# Patient Record
Sex: Male | Born: 1985 | Race: Black or African American | Hispanic: No | Marital: Single | Smoking: Current every day smoker
Health system: Southern US, Community
[De-identification: ages and names within clinical notes are randomized; demographics above are authoritative.]

## PROBLEM LIST (undated history)

## (undated) DIAGNOSIS — Z789 Other specified health status: Secondary | ICD-10-CM

## (undated) DIAGNOSIS — Z202 Contact with and (suspected) exposure to infections with a predominantly sexual mode of transmission: Secondary | ICD-10-CM

---
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
## 2010-02-16 ENCOUNTER — Emergency Department (HOSPITAL_COMMUNITY): Admission: EM | Admit: 2010-02-16 | Discharge: 2010-02-16 | Payer: Self-pay | Admitting: Emergency Medicine

## 2010-04-30 ENCOUNTER — Inpatient Hospital Stay (INDEPENDENT_AMBULATORY_CARE_PROVIDER_SITE_OTHER)
Admission: RE | Admit: 2010-04-30 | Discharge: 2010-04-30 | Disposition: A | Discharge status: Home / Self Care | Payer: Self-pay | Source: Ambulatory Visit | Attending: Family Medicine | Admitting: Family Medicine

## 2010-04-30 DIAGNOSIS — R369 Urethral discharge, unspecified: Secondary | ICD-10-CM

## 2010-04-30 DIAGNOSIS — A64 Unspecified sexually transmitted disease: Secondary | ICD-10-CM

## 2010-04-30 LAB — POCT URINALYSIS DIPSTICK
Bilirubin Urine: NEGATIVE
Ketones, ur: NEGATIVE mg/dL
Urobilinogen, UA: 0.2 mg/dL (ref 0.0–1.0)

## 2010-05-01 LAB — GC/CHLAMYDIA PROBE AMP, GENITAL: GC Probe Amp, Genital: POSITIVE — AB
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
## 2012-06-09 ENCOUNTER — Encounter (HOSPITAL_COMMUNITY): Payer: Self-pay | Admitting: *Deleted

## 2012-06-09 ENCOUNTER — Emergency Department (HOSPITAL_COMMUNITY): Payer: No Typology Code available for payment source

## 2012-06-09 ENCOUNTER — Inpatient Hospital Stay (HOSPITAL_COMMUNITY)
Admission: EM | Admit: 2012-06-09 | Discharge: 2012-06-11 | DRG: 087 | Disposition: A | Discharge status: Home / Self Care | Payer: No Typology Code available for payment source | Attending: Emergency Medicine | Admitting: Emergency Medicine

## 2012-06-09 DIAGNOSIS — F1010 Alcohol abuse, uncomplicated: Secondary | ICD-10-CM | POA: Diagnosis present

## 2012-06-09 DIAGNOSIS — S0291XB Unspecified fracture of skull, initial encounter for open fracture: Secondary | ICD-10-CM

## 2012-06-09 DIAGNOSIS — F17200 Nicotine dependence, unspecified, uncomplicated: Secondary | ICD-10-CM | POA: Diagnosis present

## 2012-06-09 DIAGNOSIS — S0280XB Fracture of other specified skull and facial bones, unspecified side, initial encounter for open fracture: Principal | ICD-10-CM | POA: Diagnosis present

## 2012-06-09 DIAGNOSIS — S61409A Unspecified open wound of unspecified hand, initial encounter: Secondary | ICD-10-CM | POA: Diagnosis present

## 2012-06-09 DIAGNOSIS — S0100XA Unspecified open wound of scalp, initial encounter: Secondary | ICD-10-CM | POA: Diagnosis present

## 2012-06-09 DIAGNOSIS — S0291XA Unspecified fracture of skull, initial encounter for closed fracture: Secondary | ICD-10-CM

## 2012-06-09 DIAGNOSIS — S0101XA Laceration without foreign body of scalp, initial encounter: Secondary | ICD-10-CM

## 2012-06-09 DIAGNOSIS — F411 Generalized anxiety disorder: Secondary | ICD-10-CM | POA: Diagnosis present

## 2012-06-09 HISTORY — DX: Other specified health status: Z78.9

## 2012-06-09 LAB — COMPREHENSIVE METABOLIC PANEL
CO2: 24 mEq/L (ref 19–32)
Calcium: 8.9 mg/dL (ref 8.4–10.5)
Creatinine, Ser: 0.76 mg/dL (ref 0.50–1.35)
GFR calc non Af Amer: >90 mL/min (ref >90)
Potassium: 3.2 mEq/L — ABNORMAL LOW (ref 3.5–5.1)
Sodium: 138 mEq/L (ref 135–145)
Total Bilirubin: 0.2 mg/dL — ABNORMAL LOW (ref 0.3–1.2)

## 2012-06-09 LAB — CBC WITH DIFFERENTIAL/PLATELET
Basophils Absolute: 0.1 10*3/uL (ref 0.0–0.1)
Eosinophils Relative: 1 % (ref 0–5)
Hemoglobin: 14.3 g/dL (ref 13.0–17.0)
Lymphocytes Relative: 54 % — ABNORMAL HIGH (ref 12–46)
Lymphs Abs: 3.6 10*3/uL (ref 0.7–4.0)
MCH: 33.1 pg (ref 26.0–34.0)
MCHC: 35.1 g/dL (ref 30.0–36.0)
MCV: 94.2 fL (ref 78.0–100.0)
Monocytes Relative: 8 % (ref 3–12)
Neutro Abs: 2.4 10*3/uL (ref 1.7–7.7)
Platelets: 224 10*3/uL (ref 150–400)
RBC: 4.32 MIL/uL (ref 4.22–5.81)

## 2012-06-09 LAB — POCT I-STAT, CHEM 8
BUN: 7 mg/dL (ref 6–23)
Calcium, Ion: 1.05 mmol/L — ABNORMAL LOW (ref 1.12–1.23)
Glucose, Bld: 140 mg/dL — ABNORMAL HIGH (ref 70–99)
HCT: 45 % (ref 39.0–52.0)

## 2012-06-09 LAB — CG4 I-STAT (LACTIC ACID): Lactic Acid, Venous: 3.28 mmol/L — ABNORMAL HIGH (ref 0.5–2.2)

## 2012-06-09 MED ORDER — POTASSIUM CHLORIDE CRYS ER 20 MEQ PO TBCR
40.0000 meq | EXTENDED_RELEASE_TABLET | Freq: Once | ORAL | Status: AC
  Administered 2012-06-09: 40 meq via ORAL
  Filled 2012-06-09: qty 2

## 2012-06-09 MED ORDER — TETANUS-DIPHTH-ACELL PERTUSSIS 5-2.5-18.5 LF-MCG/0.5 IM SUSP
0.5000 mL | Freq: Once | INTRAMUSCULAR | Status: AC
  Administered 2012-06-09: 0.5 mL via INTRAMUSCULAR
  Filled 2012-06-09: qty 0.5
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
## 2012-06-09 NOTE — ED Notes (Signed)
Vital signs stable. 
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
## 2012-06-09 NOTE — Progress Notes (Signed)
Orthopedic Tech Progress Note Patient Details:  Hunter Hart [birth year] [id number]  Patient ID: Hunter Hart, male   DOB: July 12, 1985, 27 y.o.   MRN: [id number] Made level 1 trauma visit  Nikki Dom 06/09/2012, 10:42 PM
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
## 2012-06-10 ENCOUNTER — Encounter (HOSPITAL_COMMUNITY): Payer: Self-pay | Admitting: *Deleted

## 2012-06-10 ENCOUNTER — Emergency Department (HOSPITAL_COMMUNITY): Payer: No Typology Code available for payment source

## 2012-06-10 DIAGNOSIS — S020XXA Fracture of vault of skull, initial encounter for closed fracture: Secondary | ICD-10-CM

## 2012-06-10 LAB — COMPREHENSIVE METABOLIC PANEL
AST: 39 U/L — ABNORMAL HIGH (ref 0–37)
Albumin: 3.2 g/dL — ABNORMAL LOW (ref 3.5–5.2)
BUN: 5 mg/dL — ABNORMAL LOW (ref 6–23)
Creatinine, Ser: 0.65 mg/dL (ref 0.50–1.35)
Total Protein: 6.8 g/dL (ref 6.0–8.3)

## 2012-06-10 LAB — CBC
HCT: 33.1 % — ABNORMAL LOW (ref 39.0–52.0)
MCHC: 35.6 g/dL (ref 30.0–36.0)
MCV: 91.7 fL (ref 78.0–100.0)
Platelets: 195 10*3/uL (ref 150–400)
RDW: 14.3 % (ref 11.5–15.5)
WBC: 5.9 10*3/uL (ref 4.0–10.5)

## 2012-06-10 LAB — ABO/RH: ABO/RH(D): B POS

## 2012-06-10 LAB — MRSA PCR SCREENING: MRSA by PCR: NEGATIVE

## 2012-06-10 MED ORDER — HYDROMORPHONE HCL PF 1 MG/ML IJ SOLN: 1.0000 mg | INTRAMUSCULAR | Status: DC | PRN

## 2012-06-10 MED ORDER — HYDROMORPHONE HCL PF 1 MG/ML IJ SOLN
1.0000 mg | INTRAMUSCULAR | Status: DC | PRN
  Administered 2012-06-10 (×5): 1 mg via INTRAVENOUS
  Filled 2012-06-10 (×5): qty 1

## 2012-06-10 MED ORDER — ONDANSETRON HCL 4 MG/2ML IJ SOLN
4.0000 mg | Freq: Four times a day (QID) | INTRAMUSCULAR | Status: DC | PRN
  Administered 2012-06-10 (×2): 4 mg via INTRAVENOUS
  Filled 2012-06-10 (×2): qty 2

## 2012-06-10 MED ORDER — CEFAZOLIN SODIUM-DEXTROSE 2-3 GM-% IV SOLR
2.0000 g | Freq: Once | INTRAVENOUS | Status: AC
  Administered 2012-06-10: 2 g via INTRAVENOUS
  Filled 2012-06-10: qty 50

## 2012-06-10 MED ORDER — ONDANSETRON HCL 4 MG PO TABS: 4.0000 mg | ORAL_TABLET | Freq: Four times a day (QID) | ORAL | Status: DC | PRN

## 2012-06-10 MED ORDER — PANTOPRAZOLE SODIUM 40 MG PO TBEC
40.0000 mg | DELAYED_RELEASE_TABLET | Freq: Every day | ORAL | Status: DC
  Administered 2012-06-11: 40 mg via ORAL
  Filled 2012-06-10: qty 1

## 2012-06-10 MED ORDER — CEFAZOLIN SODIUM 1-5 GM-% IV SOLN
1.0000 g | Freq: Three times a day (TID) | INTRAVENOUS | Status: DC
  Administered 2012-06-10 – 2012-06-11 (×4): 1 g via INTRAVENOUS
  Filled 2012-06-10 (×9): qty 50

## 2012-06-10 MED ORDER — MORPHINE SULFATE 2 MG/ML IJ SOLN
2.0000 mg | INTRAMUSCULAR | Status: DC | PRN
  Administered 2012-06-11 (×2): 2 mg via INTRAVENOUS
  Filled 2012-06-10 (×2): qty 1

## 2012-06-10 MED ORDER — PANTOPRAZOLE SODIUM 40 MG IV SOLR
40.0000 mg | Freq: Every day | INTRAVENOUS | Status: DC
  Administered 2012-06-10: 40 mg via INTRAVENOUS
  Filled 2012-06-10 (×3): qty 40

## 2012-06-10 MED ORDER — DEXTROSE-NACL 5-0.9 % IV SOLN
INTRAVENOUS | Status: DC
  Administered 2012-06-10: 03:00:00 via INTRAVENOUS

## 2012-06-10 MED ORDER — SODIUM CHLORIDE 0.9 % IV BOLUS (SEPSIS)
2000.0000 mL | Freq: Once | INTRAVENOUS | Status: AC
  Administered 2012-06-10: 2000 mL via INTRAVENOUS

## 2012-06-10 MED ORDER — DEXTROSE-NACL 5-0.9 % IV SOLN
INTRAVENOUS | Status: DC
  Administered 2012-06-10: 17:00:00 via INTRAVENOUS
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
## 2012-06-10 NOTE — ED Notes (Signed)
Patient transported to CT 

## 2012-06-10 NOTE — Progress Notes (Signed)
Pt arrived to 3309 from ED.  Alert and oriented, complaints of pain.  Vital signs stable.  Will continue to monitor.    Maximino Greenland RN

## 2012-06-10 NOTE — Progress Notes (Signed)
Pt transferred to 6N28 from 3300 via wheelchair.  Alert and oriented, medicated for pain.  Eating regular diet without nausea.  Oriented to room and dept.  Old, dried bloody drainage noted on head, will cleanse.  PERRL, no c/o numbness or tingling in arms or legs.

## 2012-06-11 MED ORDER — HYDROCODONE-ACETAMINOPHEN 5-325 MG PO TABS: 1.0000 | ORAL_TABLET | ORAL | Status: DC | PRN

## 2012-06-11 NOTE — Progress Notes (Signed)
Patient ID: Hunter Hart, male   DOB: 1985-12-31, 27 y.o.   MRN: [id number] BP 118/73  Pulse 63  Temp(Src) 97.6 F (36.4 C) (Oral)  Resp 18  Ht 5\' 9"  (1.753 m)  Wt 66.9 kg (147 lb 7.8 oz)  BMI 21.77 kg/m2  SpO2 98% Alert and oriented x 4 Perrl, full eom Symmetric facial sensation, movements Wounds are clean, dry, without signs of infection OK for release from my standpoint followup in 3 weeks
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
## 2012-06-28 ENCOUNTER — Telehealth (HOSPITAL_COMMUNITY): Payer: Self-pay | Admitting: Emergency Medicine

## 2012-06-28 NOTE — Telephone Encounter (Signed)
Called for an appt to get staples out as he is way overdue. Made an appt for Friday. Also wanted a refill on pain medicine because his head was still hurting. He has not yet followed up with Dr. Franky Macho. I referred him there for pain medication and gave his office phone number.

## 2012-06-30 ENCOUNTER — Encounter (INDEPENDENT_AMBULATORY_CARE_PROVIDER_SITE_OTHER): Payer: Self-pay

## 2012-06-30 ENCOUNTER — Ambulatory Visit (INDEPENDENT_AMBULATORY_CARE_PROVIDER_SITE_OTHER): Payer: No Typology Code available for payment source | Admitting: Orthopedic Surgery

## 2012-06-30 VITALS — BP 112/68 | HR 80 | Temp 97.8°F | Ht 71.25 in | Wt 152.4 lb

## 2012-06-30 DIAGNOSIS — S0291XD Unspecified fracture of skull, subsequent encounter for fracture with routine healing: Secondary | ICD-10-CM

## 2012-06-30 DIAGNOSIS — S6991XS Unspecified injury of right wrist, hand and finger(s), sequela: Secondary | ICD-10-CM

## 2012-06-30 DIAGNOSIS — S6980XA Other specified injuries of unspecified wrist, hand and finger(s), initial encounter: Secondary | ICD-10-CM

## 2012-06-30 DIAGNOSIS — S0101XD Laceration without foreign body of scalp, subsequent encounter: Secondary | ICD-10-CM
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
## 2013-08-17 ENCOUNTER — Encounter (HOSPITAL_COMMUNITY): Payer: Self-pay | Admitting: Emergency Medicine

## 2013-08-17 ENCOUNTER — Emergency Department (HOSPITAL_COMMUNITY)
Admission: EM | Admit: 2013-08-17 | Discharge: 2013-08-17 | Disposition: A | Discharge status: Home / Self Care | Payer: Self-pay | Attending: Emergency Medicine | Admitting: Emergency Medicine

## 2013-08-17 DIAGNOSIS — F17200 Nicotine dependence, unspecified, uncomplicated: Secondary | ICD-10-CM | POA: Insufficient documentation

## 2013-08-17 DIAGNOSIS — N342 Other urethritis: Secondary | ICD-10-CM | POA: Insufficient documentation

## 2013-08-17 LAB — URINE MICROSCOPIC-ADD ON

## 2013-08-17 LAB — URINALYSIS, ROUTINE W REFLEX MICROSCOPIC
BILIRUBIN URINE: NEGATIVE
Glucose, UA: NEGATIVE mg/dL
Hgb urine dipstick: NEGATIVE
KETONES UR: 15 mg/dL — AB
NITRITE: NEGATIVE
PROTEIN: 30 mg/dL — AB
SPECIFIC GRAVITY, URINE: 1.028 (ref 1.005–1.030)
UROBILINOGEN UA: 1 mg/dL (ref 0.0–1.0)
pH: 6.5 (ref 5.0–8.0)

## 2013-08-17 MED ORDER — AZITHROMYCIN 250 MG PO TABS
1000.0000 mg | ORAL_TABLET | Freq: Once | ORAL | Status: AC
  Administered 2013-08-17: 1000 mg via ORAL
  Filled 2013-08-17: qty 4

## 2013-08-17 MED ORDER — CEFTRIAXONE SODIUM 250 MG IJ SOLR
250.0000 mg | Freq: Once | INTRAMUSCULAR | Status: AC
  Administered 2013-08-17: 250 mg via INTRAMUSCULAR
  Filled 2013-08-17: qty 250

## 2013-08-17 MED ORDER — LIDOCAINE HCL (PF) 1 % IJ SOLN
INTRAMUSCULAR | Status: AC
  Filled 2013-08-17: qty 5

## 2013-08-17 NOTE — ED Notes (Signed)
Present with white penile discharge- thinks it is chlamydia- had before. Began 2 days ago.

## 2013-08-17 NOTE — ED Provider Notes (Signed)
CSN: [id number]     Arrival date & time 08/17/13  1717 History  This chart was scribed for non-physician practitioner Felicie Morn, NP working with Glynn Octave, MD by Valera Castle, ED scribe. This patient was seen in room TR10C/TR10C and the patient's care was started at 6:46 PM.   Chief Complaint  Patient presents with  . SEXUALLY TRANSMITTED DISEASE   (Consider location/radiation/quality/duration/timing/severity/associated sxs/prior Treatment) Patient is a 28 y.o. male presenting with STD exposure. The history is provided by the patient. No language interpreter was used.  Exposure to STD This is a recurrent problem. The current episode started 2 days ago. The problem has not changed since onset.Associated symptoms comments: White penile discharge. Nothing aggravates the symptoms. Nothing relieves the symptoms.   HPI Comments: Hunter Hart is a 28 y.o. male with h/o chlamydia, who presents to the Emergency Department complaining of a possible recurrent chlamydia. He reports white penile discharge, since having intercourse with his partner 2 days ago. He denies dysuria, but does report pressure sensation when urinating. He denies any other associated symptoms.   PCP - PROVIDER NOT IN SYSTEM  Past Medical History  Diagnosis Date  . Medical history non-contributory    History reviewed. No pertinent past surgical history. History reviewed. No pertinent family history. History  Substance Use Topics  . Smoking status: Current Every Day Smoker    Types: Cigarettes  . Smokeless tobacco: Current User  . Alcohol Use: Yes   Review of Systems  Constitutional: Negative for fever.  Genitourinary: Positive for discharge (white). Negative for dysuria.  All other systems reviewed and are negative.  Allergies  Review of patient's allergies indicates no known allergies.  Home Medications   Prior to Admission medications   Not on File   BP 122/79  Pulse 95  Temp(Src) 97.6 F (36.4 C)  (Oral)  Resp 20  Ht 5\' 9"  (1.753 m)  Wt 150 lb (68.04 kg)  BMI 22.14 kg/m2  SpO2 96% Physical Exam  Nursing note and vitals reviewed. Constitutional: He is oriented to person, place, and time. He appears well-developed and well-nourished. No distress.  HENT:  Head: Normocephalic and atraumatic.  Eyes: EOM are normal.  Neck: Neck supple.  Cardiovascular: Normal rate.   Pulmonary/Chest: Effort normal. No respiratory distress.  Musculoskeletal: Normal range of motion.  Neurological: He is alert and oriented to person, place, and time.  Skin: Skin is warm and dry.  Psychiatric: He has a normal mood and affect. His behavior is normal.   ED Course  Procedures (including critical care time)  DIAGNOSTIC STUDIES: Oxygen Saturation is 96% on room air, normal by my interpretation.    COORDINATION OF CARE: 6:49 PM-Discussed treatment plan which includes a culture swab with pt at bedside and pt agreed to plan.   Results for orders placed during the hospital encounter of 08/17/13  URINALYSIS, ROUTINE W REFLEX MICROSCOPIC      Result Value Ref Range   Color, Urine AMBER (*) YELLOW   APPearance CLOUDY (*) CLEAR   Specific Gravity, Urine 1.028  1.005 - 1.030   pH 6.5  5.0 - 8.0   Glucose, UA NEGATIVE  NEGATIVE mg/dL   Hgb urine dipstick NEGATIVE  NEGATIVE   Bilirubin Urine NEGATIVE  NEGATIVE   Ketones, ur 15 (*) NEGATIVE mg/dL   Protein, ur 30 (*) NEGATIVE mg/dL   Urobilinogen, UA 1.0  0.0 - 1.0 mg/dL   Nitrite NEGATIVE  NEGATIVE   Leukocytes, UA LARGE (*) NEGATIVE  URINE MICROSCOPIC-ADD  ON      Result Value Ref Range   Squamous Epithelial / LPF FEW (*) RARE   WBC, UA TOO NUMEROUS TO COUNT  <3 WBC/hpf   Bacteria, UA RARE  RARE   Urine-Other MUCOUS PRESENT     No results found.   EKG Interpretation None     Medications - No data to display MDM   Final diagnoses:  None    Urethritis.  STD screening initiated.  Rocephin, azithromycin, follow-up with STD clinic.  I  personally performed the services described in this documentation, which was scribed in my presence. The recorded information has been reviewed and is accurate.    Jimmye Normanavid John Jayston Trevino, NP 08/18/13 [phone]

## 2013-08-17 NOTE — Discharge Instructions (Signed)
Urethritis, Adult  Urethritis is an inflammation of the tube through which urine exits your bladder (urethra).   CAUSES  Urethritis is often caused by an infection in your urethra. The infection can be viral, like herpes. The infection can also be bacterial, like gonorrhea.  RISK FACTORS  Risk factors of urethritis include:  · Having sex without using a condom.  · Having multiple sexual partners.  · Having poor hygiene.  SIGNS AND SYMPTOMS  Symptoms of urethritis are less noticeable in women than in men. These symptoms include:  · Burning feeling when you urinate (dysuria).  · Discharge from your urethra.  · Blood in your urine (hematuria).  · Urinating more than usual.  DIAGNOSIS   To confirm a diagnosis of urethritis, your health care provider will do the following:  · Ask about your sexual history.  · Perform a physical exam.  · Have you provide a sample of your urine for lab testing.  · Use a cotton swab to gently collect a sample from your urethra for lab testing.  TREATMENT   It is important to treat urethritis. Depending on the cause, untreated urethritis may lead to serious genital infections and possibly infertility. Urethritis caused by a bacterial infection is treated with antibiotics. All sexual partners must be treated.   HOME CARE INSTRUCTIONS  · Do not have sex until the test results are known and treatment is completed, even if your symptoms go away before you finish treatment.  · Finish all medicines that you are prescribed.  SEEK MEDICAL CARE IF:   · Your symptoms are not improved in 3 days.  · Your symptoms are getting worse.  · You develop abdominal pain or pelvic pain (in women).  · You develop joint pain.  SEEK IMMEDIATE MEDICAL CARE IF:   · You have a fever with a temperature of 101.8°F (38.8°C) or greater.  · You have severe pain in the belly, back, or side.  · You have repeated vomiting.  Document Released: 09/01/2000 Document Revised: 12/27/2012 Document Reviewed: 11/06/2012  [employer]®  Patient Information ©2014 [employer].

## 2013-08-18 LAB — HIV ANTIBODY (ROUTINE TESTING W REFLEX): HIV: NONREACTIVE

## 2013-08-18 LAB — RPR

## 2013-08-18 NOTE — ED Provider Notes (Signed)
Medical screening examination/treatment/procedure(s) were performed by non-physician practitioner and as supervising physician I was immediately available for consultation/collaboration.   EKG Interpretation None       Glynn Octave, MD 08/18/13 [phone]

## 2013-08-20 LAB — GC/CHLAMYDIA PROBE AMP
CT Probe RNA: NEGATIVE
GC PROBE AMP APTIMA: NEGATIVE

## 2013-10-08 ENCOUNTER — Emergency Department (HOSPITAL_COMMUNITY): Payer: Self-pay

## 2013-10-08 ENCOUNTER — Encounter (HOSPITAL_COMMUNITY): Payer: Self-pay | Admitting: Emergency Medicine

## 2013-10-08 ENCOUNTER — Emergency Department (HOSPITAL_COMMUNITY)
Admission: EM | Admit: 2013-10-08 | Discharge: 2013-10-08 | Disposition: A | Discharge status: Home / Self Care | Payer: Self-pay | Attending: Emergency Medicine | Admitting: Emergency Medicine

## 2013-10-08 DIAGNOSIS — S41009A Unspecified open wound of unspecified shoulder, initial encounter: Secondary | ICD-10-CM | POA: Insufficient documentation

## 2013-10-08 DIAGNOSIS — F17200 Nicotine dependence, unspecified, uncomplicated: Secondary | ICD-10-CM | POA: Insufficient documentation

## 2013-10-08 DIAGNOSIS — M79644 Pain in right finger(s): Secondary | ICD-10-CM

## 2013-10-08 DIAGNOSIS — S6980XA Other specified injuries of unspecified wrist, hand and finger(s), initial encounter: Secondary | ICD-10-CM | POA: Insufficient documentation

## 2013-10-08 DIAGNOSIS — S41032A Puncture wound without foreign body of left shoulder, initial encounter: Secondary | ICD-10-CM

## 2013-10-08 DIAGNOSIS — S6990XA Unspecified injury of unspecified wrist, hand and finger(s), initial encounter: Secondary | ICD-10-CM | POA: Insufficient documentation

## 2013-10-08 DIAGNOSIS — Z23 Encounter for immunization: Secondary | ICD-10-CM | POA: Insufficient documentation

## 2013-10-08 MED ORDER — IBUPROFEN 800 MG PO TABS
800.0000 mg | ORAL_TABLET | Freq: Once | ORAL | Status: AC
  Administered 2013-10-08: 800 mg via ORAL
  Filled 2013-10-08: qty 1

## 2013-10-08 MED ORDER — MELOXICAM 7.5 MG PO TABS: 15.0000 mg | ORAL_TABLET | Freq: Every day | ORAL | Status: DC

## 2013-10-08 MED ORDER — BACITRACIN 500 UNIT/GM EX OINT
1.0000 "application " | TOPICAL_OINTMENT | Freq: Two times a day (BID) | CUTANEOUS | Status: DC
  Administered 2013-10-08: 1 via TOPICAL
  Filled 2013-10-08 (×2): qty 0.9

## 2013-10-08 MED ORDER — TETANUS-DIPHTH-ACELL PERTUSSIS 5-2.5-18.5 LF-MCG/0.5 IM SUSP
0.5000 mL | Freq: Once | INTRAMUSCULAR | Status: AC
  Administered 2013-10-08: 0.5 mL via INTRAMUSCULAR
  Filled 2013-10-08: qty 0.5
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
## 2013-10-08 NOTE — ED Notes (Signed)
Patient transported to X-ray 

## 2013-10-08 NOTE — Discharge Instructions (Signed)
Please follow up with your primary care physician in 1-2 days. If you do not have one please call the [facility] number listed above. Your x-ray did not reveal any broken bones in your finger. Please use ice and Mobic to help with pain and swelling. Please keep your wound clean, dry and covered with products such as neosporin or bacitracin. Please read all discharge instructions and return precautions.   Wound Care Wound care helps prevent pain and infection.  You may need a tetanus shot if:  You cannot remember when you had your last tetanus shot.  You have never had a tetanus shot.  The injury broke your skin. If you need a tetanus shot and you choose not to have one, you may get tetanus. Sickness from tetanus can be serious. HOME CARE   Only take medicine as told by your doctor.  Clean the wound daily with mild soap and water.  Change any bandages (dressings) as told by your doctor.  Put medicated cream and a bandage on the wound as told by your doctor.  Change the bandage if it gets wet, dirty, or starts to smell.  Take showers. Do not take baths, swim, or do anything that puts your wound under water.  Rest and raise (elevate) the wound until the pain and puffiness (swelling) are better.  Keep all doctor visits as told. GET HELP RIGHT AWAY IF:   Yellowish-white fluid (pus) comes from the wound.  Medicine does not lessen your pain.  There is a red streak going away from the wound.  You have a fever. MAKE SURE YOU:   Understand these instructions.  Will watch your condition.  Will get help right away if you are not doing well or get worse. Document Released: 12/16/2007 Document Revised: 05/31/2011 Document Reviewed: 07/12/2010 [facility] Patient Information 2015 [employer] [place]. This information is not intended to replace advice given to you by your health care provider. Make sure you discuss any questions you have with your health care  provider.  RICE: Routine Care for Injuries The routine care of many injuries includes Rest, Ice, Compression, and Elevation (RICE). HOME CARE INSTRUCTIONS  Rest is needed to allow your body to heal. Routine activities can usually be resumed when comfortable. Injured tendons and bones can take up to 6 weeks to heal. Tendons are the cord-like structures that attach muscle to bone.  Ice following an injury helps keep the swelling down and reduces pain.  Put ice in a plastic bag.  Place a towel between your skin and the bag.  Leave the ice on for 15-20 minutes, 3-4 times a day, or as directed by your health care provider. Do this while awake, for the first 24 to 48 hours. After that, continue as directed by your caregiver.  Compression helps keep swelling down. It also gives support and helps with discomfort. If an elastic bandage has been applied, it should be removed and reapplied every 3 to 4 hours. It should not be applied tightly, but firmly enough to keep swelling down. Watch fingers or toes for swelling, bluish discoloration, coldness, numbness, or excessive pain. If any of these problems occur, remove the bandage and reapply loosely. Contact your caregiver if these problems continue.  Elevation helps reduce swelling and decreases pain. With extremities, such as the arms, hands, legs, and feet, the injured area should be placed near or above the level of the heart, if possible. SEEK IMMEDIATE MEDICAL CARE IF:  You have persistent  pain and swelling.  You develop redness, numbness, or unexpected weakness.  Your symptoms are getting worse rather than improving after several days. These symptoms may indicate that further evaluation or further X-rays are needed. Sometimes, X-rays may not show a small broken bone (fracture) until 1 week or 10 days later. Make a follow-up appointment with your caregiver. Ask when your X-ray results will be ready. Make sure you get your X-ray results. Document  Released: 06/20/2000 Document Revised: 03/13/2013 Document Reviewed: 08/07/2010 [facility] Patient Information 2015 [place], [place]. This information is not intended to replace advice given to you by your health care provider. Make sure you discuss any questions you have with your health care provider.

## 2013-10-10 NOTE — ED Provider Notes (Signed)
Medical screening examination/treatment/procedure(s) were performed by non-physician practitioner and as supervising physician I was immediately available for consultation/collaboration.   EKG Interpretation None       Dorianna Mckiver R. Taffie Eckmann, MD 10/10/13 0721 

## 2014-01-22 ENCOUNTER — Emergency Department (HOSPITAL_COMMUNITY)
Admission: EM | Admit: 2014-01-22 | Discharge: 2014-01-22 | Disposition: A | Discharge status: Home / Self Care | Payer: Self-pay | Attending: Emergency Medicine | Admitting: Emergency Medicine

## 2014-01-22 ENCOUNTER — Encounter (HOSPITAL_COMMUNITY): Payer: Self-pay | Admitting: Physical Medicine and Rehabilitation

## 2014-01-22 DIAGNOSIS — Z720 Tobacco use: Secondary | ICD-10-CM | POA: Insufficient documentation

## 2014-01-22 DIAGNOSIS — Z79899 Other long term (current) drug therapy: Secondary | ICD-10-CM | POA: Insufficient documentation

## 2014-01-22 DIAGNOSIS — Z791 Long term (current) use of non-steroidal anti-inflammatories (NSAID): Secondary | ICD-10-CM | POA: Insufficient documentation

## 2014-01-22 DIAGNOSIS — R369 Urethral discharge, unspecified: Secondary | ICD-10-CM | POA: Insufficient documentation

## 2014-01-22 DIAGNOSIS — A64 Unspecified sexually transmitted disease: Secondary | ICD-10-CM | POA: Insufficient documentation

## 2014-01-22 DIAGNOSIS — Z87898 Personal history of other specified conditions: Secondary | ICD-10-CM | POA: Insufficient documentation

## 2014-01-22 LAB — URINE MICROSCOPIC-ADD ON

## 2014-01-22 LAB — URINALYSIS, ROUTINE W REFLEX MICROSCOPIC
Bilirubin Urine: NEGATIVE
GLUCOSE, UA: NEGATIVE mg/dL
Ketones, ur: 15 mg/dL — AB
Nitrite: NEGATIVE
PH: 6 (ref 5.0–8.0)
Protein, ur: 30 mg/dL — AB
SPECIFIC GRAVITY, URINE: 1.029 (ref 1.005–1.030)
Urobilinogen, UA: 1 mg/dL (ref 0.0–1.0)

## 2014-01-22 MED ORDER — LIDOCAINE HCL (PF) 1 % IJ SOLN
2.0000 mL | Freq: Once | INTRAMUSCULAR | Status: AC
  Administered 2014-01-22: 2 mL
  Filled 2014-01-22: qty 5

## 2014-01-22 MED ORDER — AZITHROMYCIN 250 MG PO TABS
1000.0000 mg | ORAL_TABLET | Freq: Once | ORAL | Status: AC
  Administered 2014-01-22: 1000 mg via ORAL
  Filled 2014-01-22: qty 4

## 2014-01-22 MED ORDER — CEFTRIAXONE SODIUM 250 MG IJ SOLR
250.0000 mg | Freq: Once | INTRAMUSCULAR | Status: AC
  Administered 2014-01-22: 250 mg via INTRAMUSCULAR
  Filled 2014-01-22: qty 250

## 2014-01-22 NOTE — ED Notes (Signed)
Pt discharged after injection given.  Pt refuses to wait .  St's he has had these medications before and needs to catch the bus.

## 2014-01-22 NOTE — ED Provider Notes (Signed)
CSN: [id number]     Arrival date & time 01/22/14  1736 History  This chart was scribed for non-physician practitioner, Elpidio AnisShari Brynlie Daza, PA-C, working with Lyanne CoKevin M Campos, MD, by Bronson CurbJacqueline Melvin, ED Scribe. This patient was seen in room TR11C/TR11C and the patient's care was started at 6:53 PM.     Chief Complaint  Patient presents with  . Exposure to STD  . Penile Discharge    Patient is a 28 y.o. male presenting with STD exposure and penile discharge. The history is provided by the patient. No language interpreter was used.  Exposure to STD This is a new problem. The current episode started yesterday. The problem has not changed since onset.Pertinent negatives include no abdominal pain. Nothing aggravates the symptoms. Nothing relieves the symptoms. He has tried nothing for the symptoms.  Penile Discharge Pertinent negatives include no abdominal pain.     HPI Comments: Annye EnglishJustin T Canner is a 28 y.o. male who presents to the Emergency Department complaining of penile discharge noted yesterday. Patient suspects exposure to STD. There is associated mild dysuria. Patient denies any abdominal pain, nausea, vomiting, or testicualr pain/swelling.   Past Medical History  Diagnosis Date  . Medical history non-contributory    History reviewed. No pertinent past surgical history. History reviewed. No pertinent family history. History  Substance Use Topics  . Smoking status: Current Every Day Smoker    Types: Cigarettes  . Smokeless tobacco: Current User  . Alcohol Use: Yes    Review of Systems  Constitutional: Negative for fever.  Gastrointestinal: Negative for nausea, vomiting and abdominal pain.  Genitourinary: Positive for dysuria (mild) and discharge. Negative for penile swelling, scrotal swelling, penile pain and testicular pain.      Allergies  Review of patient's allergies indicates no known allergies.  Home Medications   Prior to Admission medications   Medication Sig Start  Date End Date Taking? Authorizing Provider  Acetaminophen (TYLENOL EXTRA STRENGTH PO) Take 3 tablets by mouth daily as needed (for pain or headache).    Historical Provider, MD  ibuprofen (ADVIL,MOTRIN) 200 MG tablet Take 400-600 mg by mouth every 6 (six) hours as needed for headache or moderate pain.    Historical Provider, MD  meloxicam (MOBIC) 7.5 MG tablet Take 2 tablets (15 mg total) by mouth daily. 10/08/13   Lise AuerJennifer L Piepenbrink, PA-C   Triage Vitals: BP 141/69 mmHg  Pulse 78  Temp(Src) 97.5 F (36.4 C) (Oral)  Resp 18  Ht 5\' 9"  (1.753 m)  Wt 165 lb (74.844 kg)  BMI 24.36 kg/m2  SpO2 97%  Physical Exam  Constitutional: He is oriented to person, place, and time. He appears well-developed and well-nourished. No distress.  HENT:  Head: Normocephalic and atraumatic.  Eyes: Conjunctivae and EOM are normal.  Neck: No tracheal deviation present.  Cardiovascular: Normal rate.   Pulmonary/Chest: Effort normal. No respiratory distress.  Genitourinary:  Penile discharge present, purulent. No scrotal swelling.   Musculoskeletal: Normal range of motion.  Neurological: He is alert and oriented to person, place, and time.  Skin: Skin is warm and dry.  Psychiatric: He has a normal mood and affect. His behavior is normal.  Nursing note and vitals reviewed.   ED Course  Procedures (including critical care time)  DIAGNOSTIC STUDIES: Oxygen Saturation is 97% on room air, adequate by my interpretation.    COORDINATION OF CARE: At 1854 Discussed treatment plan with patient which includes ABX. Patient agrees.   Labs Review Labs Reviewed  URINALYSIS, ROUTINE W  REFLEX MICROSCOPIC - Abnormal; Notable for the following:    Color, Urine AMBER (*)    APPearance CLOUDY (*)    Hgb urine dipstick MODERATE (*)    Ketones, ur 15 (*)    Protein, ur 30 (*)    Leukocytes, UA LARGE (*)    All other components within normal limits  URINE MICROSCOPIC-ADD ON - Abnormal; Notable for the following:     Bacteria, UA FEW (*)    All other components within normal limits    Imaging Review No results found.   EKG Interpretation None      MDM   Final diagnoses:  None    1. STD  Medications given in ED with patient education on STD provided. Non-toxic pain, no abdominal pain. Afebrile. Stable for discharge.  I personally performed the services described in this documentation, which was scribed in my presence. The recorded information has been reviewed and is accurate.     Arnoldo HookerShari A Andruw Battie, PA-C 01/22/14 1913

## 2014-01-22 NOTE — ED Notes (Signed)
Pt presents to department for evaluation of possible STD. Pt states penile discharge x1 day. Denies abdominal pain.

## 2014-01-22 NOTE — Discharge Instructions (Signed)
Sexually Transmitted Disease °A sexually transmitted disease (STD) is a disease or infection often passed to another person during sex. However, STDs can be passed through nonsexual ways. An STD can be passed through: °· Spit (saliva). °· Semen. °· Blood. °· Mucus from the vagina. °· Pee (urine). °HOW CAN I LESSEN MY CHANCES OF GETTING AN STD? °· Use: °· Latex condoms. °· Water-soluble lubricants with condoms. Do not use petroleum jelly or oils. °· Dental dams. These are small pieces of latex that are used as a barrier during oral sex. °· Avoid having more than one sex partner. °· Do not have sex with someone who has other sex partners. °· Do not have sex with anyone you do not know or who is at high risk for an STD. °· Avoid risky sex that can break your skin. °· Do not have sex if you have open sores on your mouth or skin. °· Avoid drinking too much alcohol or taking illegal drugs. Alcohol and drugs can affect your good judgment. °· Avoid oral and anal sex acts. °· Get shots (vaccines) for HPV and hepatitis. °· If you are at risk of being infected with HIV, it is advised that you take a certain medicine daily to prevent HIV infection. This is called pre-exposure prophylaxis (PrEP). You may be at risk if: °· You are a man who has sex with other men (MSM). °· You are attracted to the opposite sex (heterosexual) and are having sex with more than one partner. °· You take drugs with a needle. °· You have sex with someone who has HIV. °· Talk with your doctor about if you are at high risk of being infected with HIV. If you begin to take PrEP, get tested for HIV first. Get tested every 3 months for as long as you are taking PrEP. °WHAT SHOULD I DO IF I THINK I HAVE AN STD? °· See your doctor. °· Tell your sex partner(s) that you have an STD. They should be tested and treated. °· Do not have sex until your doctor says it is okay. °WHEN SHOULD I GET HELP? °Get help right away if: °· You have bad belly (abdominal)  pain. °· You are a man and have puffiness (swelling) or pain in your testicles. °· You are a woman and have puffiness in your vagina. °Document Released: 04/15/2004 Document Revised: 03/13/2013 Document Reviewed: 09/01/2012 °ExitCare® Patient Information ©2015 [employer]. This information is not intended to replace advice given to you by your health care provider. Make sure you discuss any questions you have with your health care provider. ° °Safe Sex °Safe sex is about reducing the risk of giving or getting a sexually transmitted disease (STD). STDs are spread through sexual contact involving the genitals, mouth, or rectum. Some STDs can be cured and others cannot. Safe sex can also prevent unintended pregnancies.  °WHAT ARE SOME SAFE SEX PRACTICES? °· Limit your sexual activity to only one partner who is having sex with only you. °· Talk to your partner about his or her past partners, past STDs, and drug use. °· Use a condom every time you have sexual intercourse. This includes vaginal, oral, and anal sexual activity. Both females and males should wear condoms during oral sex. Only use latex or polyurethane condoms and water-based lubricants. Using petroleum-based lubricants or oils to lubricate a condom will weaken the condom and increase the chance that it will break. The condom should be in place from the beginning to the end   sexual activity. Wearing a condom reduces, but does not completely eliminate, your risk of getting or giving an STD. STDs can be spread by contact with infected body fluids and skin.  Get vaccinated for hepatitis B and HPV.  Avoid alcohol and recreational drugs, which can affect your judgment. You may forget to use a condom or participate in high-risk sex.  For females, avoid douching after sexual intercourse. Douching can spread an infection farther into the reproductive tract.  Check your body for signs of sores, blisters, rashes, or unusual discharge. See your health care  provider if you notice any of these signs.  Avoid sexual contact if you have symptoms of an infection or are being treated for an STD. If you or your partner has herpes, avoid sexual contact when blisters are present. Use condoms at all other times.  If you are at risk of being infected with HIV, it is recommended that you take a prescription medicine daily to prevent HIV infection. This is called pre-exposure prophylaxis (PrEP). You are considered at risk if:  You are a man who has sex with other men (MSM).  You are a heterosexual man or woman who is sexually active with more than one partner.  You take drugs by injection.  You are sexually active with a partner who has HIV.  Talk with your health care provider about whether you are at high risk of being infected with HIV. If you choose to begin PrEP, you should first be tested for HIV. You should then be tested every 3 months for as long as you are taking PrEP.  See your health care provider for regular screenings, exams, and tests for other STDs. Before having sex with a new partner, each of you should be screened for STDs and should talk about the results with each other. WHAT ARE THE BENEFITS OF SAFE SEX?   There is less chance of getting or giving an STD.  You can prevent unwanted or unintended pregnancies.  By discussing safe sex concerns with your partner, you may increase feelings of intimacy, comfort, trust, and honesty between the two of you. Document Released: 04/15/2004 Document Revised: 07/23/2013 Document Reviewed: 08/30/2011 [facility] LP[facility] Patient Information 2015 [place], [place]. This information is not intended to replace advice given to you by your health care provider. Make sure you discuss any questions you have with your health care provider.

## 2014-01-22 NOTE — ED Notes (Signed)
Pt st's he knows he has a STD because he has had them in the past and knows the symptoms

## 2014-01-23 LAB — GC/CHLAMYDIA PROBE AMP
CT PROBE, AMP APTIMA: NEGATIVE
GC PROBE AMP APTIMA: POSITIVE — AB

## 2014-01-24 ENCOUNTER — Telehealth (HOSPITAL_COMMUNITY): Payer: Self-pay

## 2014-01-24 NOTE — ED Notes (Signed)
return call. verified ID, informed of labs. treated per protocol. [facility] faxed.  advised to notify partner(s) and abstain from sexual activity x 10 days

## 2014-01-24 NOTE — ED Notes (Signed)
[facility] form faxed 

## 2014-10-28 ENCOUNTER — Emergency Department (HOSPITAL_COMMUNITY)
Admission: EM | Admit: 2014-10-28 | Discharge: 2014-10-28 | Disposition: A | Discharge status: Home / Self Care | Payer: Self-pay | Attending: Emergency Medicine | Admitting: Emergency Medicine

## 2014-10-28 ENCOUNTER — Encounter (HOSPITAL_COMMUNITY): Payer: Self-pay | Admitting: *Deleted

## 2014-10-28 DIAGNOSIS — R360 Urethral discharge without blood: Secondary | ICD-10-CM | POA: Insufficient documentation

## 2014-10-28 DIAGNOSIS — Z202 Contact with and (suspected) exposure to infections with a predominantly sexual mode of transmission: Secondary | ICD-10-CM | POA: Insufficient documentation

## 2014-10-28 DIAGNOSIS — Z720 Tobacco use: Secondary | ICD-10-CM | POA: Insufficient documentation

## 2014-10-28 DIAGNOSIS — Z711 Person with feared health complaint in whom no diagnosis is made: Secondary | ICD-10-CM

## 2014-10-28 DIAGNOSIS — Z791 Long term (current) use of non-steroidal anti-inflammatories (NSAID): Secondary | ICD-10-CM | POA: Insufficient documentation

## 2014-10-28 HISTORY — DX: Contact with and (suspected) exposure to infections with a predominantly sexual mode of transmission: Z20.2

## 2014-10-28 LAB — URINE MICROSCOPIC-ADD ON

## 2014-10-28 LAB — URINALYSIS, ROUTINE W REFLEX MICROSCOPIC
Glucose, UA: NEGATIVE mg/dL
Ketones, ur: 15 mg/dL — AB
NITRITE: NEGATIVE
PROTEIN: 30 mg/dL — AB
SPECIFIC GRAVITY, URINE: 1.021 (ref 1.005–1.030)
UROBILINOGEN UA: 1 mg/dL (ref 0.0–1.0)
pH: 7 (ref 5.0–8.0)

## 2014-10-28 MED ORDER — LIDOCAINE HCL (PF) 1 % IJ SOLN
5.0000 mL | Freq: Once | INTRAMUSCULAR | Status: AC
  Administered 2014-10-28: 5 mL
  Filled 2014-10-28: qty 5

## 2014-10-28 MED ORDER — CEFTRIAXONE SODIUM 250 MG IJ SOLR
250.0000 mg | Freq: Once | INTRAMUSCULAR | Status: AC
  Administered 2014-10-28: 250 mg via INTRAMUSCULAR
  Filled 2014-10-28: qty 250

## 2014-10-28 MED ORDER — AZITHROMYCIN 250 MG PO TABS
1000.0000 mg | ORAL_TABLET | Freq: Once | ORAL | Status: AC
  Administered 2014-10-28: 1000 mg via ORAL
  Filled 2014-10-28: qty 4

## 2014-10-28 MED ORDER — ONDANSETRON 4 MG PO TBDP
4.0000 mg | ORAL_TABLET | Freq: Once | ORAL | Status: AC
  Administered 2014-10-28: 4 mg via ORAL
  Filled 2014-10-28: qty 1

## 2014-10-28 NOTE — ED Provider Notes (Signed)
CSN: [id number]     Arrival date & time 10/28/14  1723 History  This chart was scribed for non-physician practitioner, Dorena Dew. Neva Seat, PA-C working with Mancel Bale, MD by Gwenyth Ober, ED scribe. This patient was seen in room TR10C/TR10C and the patient's care was started at 6:22 PM   Chief Complaint  Patient presents with  . Exposure to STD   The history is provided by the patient. No language interpreter was used.    HPI Comments: RAM HAUGAN is a 29 y.o. male who presents to the Emergency Department complaining of white penile discharge that started earlier today. Pt reports he had unprotected intercourse recently. He denies fever, penile swelling, penile pain, testicular pain and blood from his penis.   PCP: No PCP Per Patient Blood pressure 122/71, pulse 63, temperature 98.3 F (36.8 C), temperature source Oral, resp. rate 18, height 5\' 11"  (1.803 m), weight 155 lb 9 oz (70.563 kg), SpO2 99 %.  The patient denies diaphoresis, fever, headache, weakness (general or focal), confusion, change of vision,  neck pain, dysphagia, aphagia, chest pain, shortness of breath,  back pain, abdominal pains, nausea, vomiting, diarrhea, lower extremity swelling, rash.    Past Medical History  Diagnosis Date  . Medical history non-contributory   . Possible exposure to STD    History reviewed. No pertinent past surgical history. History reviewed. No pertinent family history. History  Substance Use Topics  . Smoking status: Current Every Day Smoker    Types: Cigarettes  . Smokeless tobacco: Current User  . Alcohol Use: Yes    Review of Systems  Constitutional: Negative for fever.  Genitourinary: Positive for discharge. Negative for penile swelling, scrotal swelling, penile pain and testicular pain.  All other systems reviewed and are negative.     Allergies  Review of patient's allergies indicates no known allergies.  Home Medications   Prior to Admission medications    Medication Sig Start Date End Date Taking? Authorizing Provider  Acetaminophen (TYLENOL EXTRA STRENGTH PO) Take 3 tablets by mouth daily as needed (for pain or headache).    Historical Provider, MD  ibuprofen (ADVIL,MOTRIN) 200 MG tablet Take 400-600 mg by mouth every 6 (six) hours as needed for headache or moderate pain.    Historical Provider, MD  meloxicam (MOBIC) 7.5 MG tablet Take 2 tablets (15 mg total) by mouth daily. 10/08/13   Jennifer Piepenbrink, PA-C   BP 122/71 mmHg  Pulse 63  Temp(Src) 98.3 F (36.8 C) (Oral)  Resp 18  Ht 5\' 11"  (1.803 m)  Wt 155 lb 9 oz (70.563 kg)  BMI 21.71 kg/m2  SpO2 99% Physical Exam  Constitutional: He appears well-developed and well-nourished. No distress.  HENT:  Head: Normocephalic and atraumatic.  Eyes: Conjunctivae and EOM are normal.  Neck: Neck supple. No tracheal deviation present.  Cardiovascular: Normal rate.   Pulmonary/Chest: Effort normal. No respiratory distress.  Skin: Skin is warm and dry.  Psychiatric: He has a normal mood and affect. His behavior is normal.  Nursing note and vitals reviewed.   ED Course  Procedures   DIAGNOSTIC STUDIES: Oxygen Saturation is 99% on RA, normal by my interpretation.    COORDINATION OF CARE: 6:24 PM Discussed treatment plan with pt which includes GC/CHL and Rocephin. Pt agreed to plan.   Labs Review Labs Reviewed  URINALYSIS, ROUTINE W REFLEX MICROSCOPIC (NOT AT [facility])  GC/CHLAMYDIA PROBE AMP ([facility]) NOT AT [facility]    Imaging Review No results found.   EKG Interpretation  None      MDM   Final diagnoses:  Concern about STD in male without diagnosis   Advised to practice safe sex and have all partners evaluated and treated at the local health department. Also advised to follow with the health Department in 1-2 weeks to confirm effectiveness of treatment and receive additional education/evaluation. Return precautions given.   Medications  cefTRIAXone (ROCEPHIN) injection  250 mg (not administered)  azithromycin (ZITHROMAX) tablet 1,000 mg (not administered)  ondansetron (ZOFRAN-ODT) disintegrating tablet 4 mg (not administered)    28 y.o.Juanetta Snow [facility]'s evaluation in the Emergency Department is complete. It has been determined that no acute conditions requiring further emergency intervention are present at this time. The patient/guardian have been advised of the diagnosis and plan. We have discussed signs and symptoms that warrant return to the ED, such as changes or worsening in symptoms.  Vital signs are stable at discharge. Filed Vitals:   10/28/14 1758  BP: 122/71  Pulse: 63  Temp: 98.3 F (36.8 C)  Resp: 18    Patient/guardian has voiced understanding and agreed to follow-up with the PCP or specialist.   I personally performed the services described in this documentation, which was scribed in my presence. The recorded information has been reviewed and is accurate.   Marlon Pel, PA-C 10/28/14 1835  Mancel Bale, MD 10/29/14 [phone]

## 2014-10-28 NOTE — ED Notes (Signed)
Pt reports he will not stay for the 30 min observation time . 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

## 2014-10-28 NOTE — Discharge Instructions (Signed)

## 2014-10-28 NOTE — ED Notes (Signed)
Pt reports drainage from penis that started today.

## 2014-10-28 NOTE — ED Notes (Signed)
Declined W/C at D/C and was escorted to lobby by RN. 

## 2014-12-16 ENCOUNTER — Emergency Department (HOSPITAL_COMMUNITY)
Admission: EM | Admit: 2014-12-16 | Discharge: 2014-12-16 | Disposition: A | Discharge status: Home / Self Care | Payer: Self-pay | Attending: Emergency Medicine | Admitting: Emergency Medicine

## 2014-12-16 ENCOUNTER — Encounter (HOSPITAL_COMMUNITY): Payer: Self-pay | Admitting: Family Medicine

## 2014-12-16 DIAGNOSIS — Z720 Tobacco use: Secondary | ICD-10-CM | POA: Insufficient documentation

## 2014-12-16 DIAGNOSIS — K047 Periapical abscess without sinus: Secondary | ICD-10-CM | POA: Insufficient documentation

## 2014-12-16 DIAGNOSIS — K029 Dental caries, unspecified: Secondary | ICD-10-CM | POA: Insufficient documentation

## 2014-12-16 MED ORDER — AMOXICILLIN 500 MG PO CAPS: 500.0000 mg | ORAL_CAPSULE | Freq: Three times a day (TID) | ORAL | Status: DC

## 2014-12-16 MED ORDER — NAPROXEN 500 MG PO TABS: 500.0000 mg | ORAL_TABLET | Freq: Two times a day (BID) | ORAL | Status: DC

## 2014-12-16 NOTE — ED Notes (Signed)
Pt here for left sided dental pain and swelling.

## 2014-12-16 NOTE — Discharge Instructions (Signed)

## 2014-12-16 NOTE — ED Provider Notes (Signed)
CSN: [id number]     Arrival date & time 12/16/14  1557 History  This chart was scribed for non-physician practitioner Kerrie Buffalo, NP working with Benjiman Core, MD by Murriel Hopper, ED Scribe. This patient was seen in room TR05C/TR05C and the patient's care was started at 4:42 PM.    Chief Complaint  Patient presents with  . Dental Pain      The history is provided by the patient. No language interpreter was used.   HPI Comments: Hunter Hart is a 29 y.o. male who presents to the Emergency Department complaining of constant worsening left maxillary first molar pain that has been present for a few weeks. Pt states he has not been to a dentist in a long time, and did not see one today prior to coming to ED. 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 Pt reports taking Ibuprofen earlier today with little relief. Pt denies ear pain, nausea, vomiting. He complains of facial pain and swelling.  Past Medical History  Diagnosis Date  . Medical history non-contributory   . Possible exposure to STD    History reviewed. No pertinent past surgical history. History reviewed. No pertinent family history. Social History  Substance Use Topics  . Smoking status: Current Every Day Smoker    Types: Cigarettes  . Smokeless tobacco: Current User  . Alcohol Use: Yes    Review of Systems Negative except as stated in HPI  Allergies  Review of patient's allergies indicates no known allergies.  Home Medications   Prior to Admission medications   Medication Sig Start Date End Date Taking? Authorizing Provider  Acetaminophen (TYLENOL EXTRA STRENGTH PO) Take 3 tablets by mouth daily as needed (for pain or headache).    Historical Provider, MD  amoxicillin (AMOXIL) 500 MG capsule Take 1 capsule (500 mg total) by mouth 3 (three) times daily. 12/16/14   Joice Nazario Orlene Och, NP  naproxen (NAPROSYN) 500 MG tablet Take 1 tablet (500 mg total) by mouth 2 (two) times daily. 12/16/14   Travontae Freiberger Orlene Och, NP    BP 112/67 mmHg  Pulse 82  Temp(Src) 98 F (36.7 C)  Resp 18  SpO2 98% Physical Exam  Constitutional: He is oriented to person, place, and time. He appears well-developed and well-nourished.  Non-toxic appearance. No distress.  HENT:  Right Ear: Tympanic membrane normal.  Left Ear: Tympanic membrane normal.  Nose: Nose normal.  Mouth/Throat: Uvula is midline, oropharynx is clear and moist and mucous membranes are normal. Dental abscesses and dental caries present.    Upper left first molar abscess, tender on exam  Eyes: Conjunctivae, EOM and lids are normal. Pupils are equal, round, and reactive to light.  Neck: Normal range of motion. Neck supple. No thyroid mass present.  Cardiovascular: Normal rate and regular rhythm.   Pulmonary/Chest: Effort normal and breath sounds normal.  Abdominal: Soft. Normal appearance and bowel sounds are normal. He exhibits no distension. There is no tenderness. There is no rebound and no CVA tenderness.  Musculoskeletal: Normal range of motion. He exhibits no edema or tenderness.  Lymphadenopathy:    He has cervical adenopathy (left).  Neurological: He is alert and oriented to person, place, and time. He has normal strength. No cranial nerve deficit or sensory deficit.  Skin: Skin is warm and dry. No abrasion and no rash noted.  Psychiatric: He has a normal mood and affect. His speech is normal and behavior is normal.  Nursing note and vitals reviewed.  ED Course  Procedures (including critical care time)  DIAGNOSTIC STUDIES: Oxygen Saturation is 98% on room air, normal by my interpretation.    COORDINATION OF CARE: 4:46 PM Discussed treatment plan with pt at bedside and pt agreed to plan.   MDM  29 y.o. male with dental pain due to abscessed tooth. Stable for d/c without fever, difficulty swallowing and does not appear toxic. Will start antibiotics and he will follow up with a dentist ASAP.   Final diagnoses:  Dental abscess   I  personally performed the services described in this documentation, which was scribed in my presence. The recorded information has been reviewed and is accurate.    [address] [place], [place] 12/16/14 1610  Benjiman Core, MD 12/17/14 0000

## 2015-06-05 ENCOUNTER — Emergency Department (HOSPITAL_COMMUNITY)
Admission: EM | Admit: 2015-06-05 | Discharge: 2015-06-05 | Disposition: A | Discharge status: Home / Self Care | Payer: Self-pay | Attending: Emergency Medicine | Admitting: Emergency Medicine

## 2015-06-05 ENCOUNTER — Encounter (HOSPITAL_COMMUNITY): Payer: Self-pay

## 2015-06-05 ENCOUNTER — Emergency Department (HOSPITAL_COMMUNITY): Payer: Self-pay

## 2015-06-05 DIAGNOSIS — J029 Acute pharyngitis, unspecified: Secondary | ICD-10-CM | POA: Insufficient documentation

## 2015-06-05 DIAGNOSIS — J3489 Other specified disorders of nose and nasal sinuses: Secondary | ICD-10-CM | POA: Insufficient documentation

## 2015-06-05 DIAGNOSIS — G478 Other sleep disorders: Secondary | ICD-10-CM | POA: Insufficient documentation

## 2015-06-05 DIAGNOSIS — F17210 Nicotine dependence, cigarettes, uncomplicated: Secondary | ICD-10-CM | POA: Insufficient documentation

## 2015-06-05 DIAGNOSIS — R509 Fever, unspecified: Secondary | ICD-10-CM | POA: Insufficient documentation

## 2015-06-05 DIAGNOSIS — R197 Diarrhea, unspecified: Secondary | ICD-10-CM | POA: Insufficient documentation

## 2015-06-05 DIAGNOSIS — Z792 Long term (current) use of antibiotics: Secondary | ICD-10-CM | POA: Insufficient documentation

## 2015-06-05 DIAGNOSIS — R1110 Vomiting, unspecified: Secondary | ICD-10-CM | POA: Insufficient documentation

## 2015-06-05 DIAGNOSIS — R05 Cough: Secondary | ICD-10-CM | POA: Insufficient documentation

## 2015-06-05 DIAGNOSIS — R079 Chest pain, unspecified: Secondary | ICD-10-CM | POA: Insufficient documentation

## 2015-06-05 DIAGNOSIS — Z791 Long term (current) use of non-steroidal anti-inflammatories (NSAID): Secondary | ICD-10-CM | POA: Insufficient documentation

## 2015-06-05 DIAGNOSIS — R6889 Other general symptoms and signs: Secondary | ICD-10-CM

## 2015-06-05 MED ORDER — GUAIFENESIN-CODEINE 100-10 MG/5ML PO SOLN: 5.0000 mL | Freq: Four times a day (QID) | ORAL | Status: DC | PRN

## 2015-06-05 NOTE — ED Provider Notes (Signed)
CSN: [id number]     Arrival date & time 06/05/15  1646 History  By signing my name below, I, Placido SouLogan Joldersma, attest that this documentation has been prepared under the direction and in the presence of Roxy Horsemanobert Oceane Fosse, PA-C. Electronically Signed: Placido SouLogan Joldersma, ED Scribe. 06/05/2015. 5:46 PM.   Chief Complaint  Patient presents with  . Cough   The history is provided by the patient. No language interpreter was used.   HPI Comments: Annye EnglishJustin T Niznik is a 30 y.o. male who presents to the Emergency Department complaining of intermittent, mild, dry cough onset 1 week ago. He reports multiple associated cold-like symptoms including fever, chills, CP when coughing, intermittent sore throat, 1x post tussive emesis and diarrhea noting they all began 1 week ago, alleviated for a few days and returned before worsening. He notes taking Dayquil/Nyquil and Advil PM which he says provides relief of his fever and difficulty sleeping. Pt has a newborn child at home and confirms multiple sick contacts through work with flu-like symptoms. He denies body aches or any other associated symptoms at this time.   Past Medical History  Diagnosis Date  . Medical history non-contributory   . Possible exposure to STD    History reviewed. No pertinent past surgical history. No family history on file. Social History  Substance Use Topics  . Smoking status: Current Every Day Smoker    Types: Cigarettes  . Smokeless tobacco: Current User  . Alcohol Use: Yes    Review of Systems  Constitutional: Positive for fever and chills.  HENT: Positive for sore throat.   Respiratory: Positive for cough (dry).   Cardiovascular: Positive for chest pain (when coughing).  Gastrointestinal: Positive for vomiting and diarrhea. Negative for nausea.  Musculoskeletal: Negative for myalgias.  Psychiatric/Behavioral: Positive for sleep disturbance.    Allergies  Review of patient's allergies indicates no known allergies.  Home  Medications   Prior to Admission medications   Medication Sig Start Date End Date Taking? Authorizing Provider  Acetaminophen (TYLENOL EXTRA STRENGTH PO) Take 3 tablets by mouth daily as needed (for pain or headache).    Historical Provider, MD  amoxicillin (AMOXIL) 500 MG capsule Take 1 capsule (500 mg total) by mouth 3 (three) times daily. 12/16/14   Hope Orlene OchM Neese, NP  naproxen (NAPROSYN) 500 MG tablet Take 1 tablet (500 mg total) by mouth 2 (two) times daily. 12/16/14   Hope Orlene OchM Neese, NP   BP 118/78 mmHg  Pulse 76  Temp(Src) 98.3 F (36.8 C) (Oral)  Resp 16  Ht 5\' 9"  (1.753 m) Physical Exam  Constitutional: Pt  is oriented to person, place, and time. Appears well-developed and well-nourished. No distress.  HENT:  Head: Normocephalic and atraumatic.  Right Ear: Tympanic membrane, external ear and ear canal normal.  Left Ear: Tympanic membrane, external ear and ear canal normal.  Nose: Mucosal edema and mild rhinorrhea present. No epistaxis. Right sinus exhibits no maxillary sinus tenderness and no frontal sinus tenderness. Left sinus exhibits no maxillary sinus tenderness and no frontal sinus tenderness.  Mouth/Throat: Uvula is midline and mucous membranes are normal. Mucous membranes are not pale and not cyanotic. No oropharyngeal exudate, posterior oropharyngeal edema, posterior oropharyngeal erythema or tonsillar abscesses.  Eyes: Conjunctivae are normal. Pupils are equal, round, and reactive to light.  Neck: Normal range of motion and full passive range of motion without pain.  Cardiovascular: Normal rate and intact distal pulses.   Pulmonary/Chest: Effort normal and breath sounds normal. No stridor.  Clear  and equal breath sounds without focal wheezes, rhonchi, rales  Abdominal: Soft. Bowel sounds are normal. There is no tenderness.  Musculoskeletal: Normal range of motion.  Lymphadenopathy:    Pthas no cervical adenopathy.  Neurological: Pt is alert and oriented to person, place,  and time.  Skin: Skin is warm and dry. No rash noted. Pt is not diaphoretic.  Psychiatric: Normal mood and affect.  Nursing note and vitals reviewed.   ED Course  Procedures  COORDINATION OF CARE: 5:42 PM Discussed next steps with pt. He verbalized understanding and is agreeable with the plan.   Labs Review Labs Reviewed - No data to display  Imaging Review Dg Chest 2 View  06/05/2015  CLINICAL DATA:  Dry cough, fever and chest pain while coughing for a few days. Current smoker. EXAM: CHEST  2 VIEW COMPARISON:  Chest x-ray dated 06/09/2012. FINDINGS: Cardiomediastinal silhouette is normal in size and configuration. Lungs are clear. Lung volumes are normal. No evidence of pneumonia. No pleural effusion. No pneumothorax. Osseous and soft tissue structures about the chest are unremarkable. IMPRESSION: Lungs are clear and there is no evidence of acute cardiopulmonary abnormality. Electronically Signed   By: Bary Richard M.D.   On: 06/05/2015 18:02   I have personally reviewed and evaluated these images as part of my medical decision-making.    MDM   Final diagnoses:  Flu-like symptoms    Patient with symptoms consistent with influenza.  Vitals are stable, low-grade fever.  No signs of dehydration, tolerating PO's.  Lungs are clear. Due to patient's presentation and physical exam a chest x-ray was not ordered bc likely diagnosis of flu.   Patient will be discharged with instructions to orally hydrate, rest, and use over-the-counter medications such as anti-inflammatories ibuprofen and Aleve for muscle aches and Tylenol for fever.  Patient will also be given a cough suppressant.   I personally performed the services described in this documentation, which was scribed in my presence. The recorded information has been reviewed and is accurate.      Roxy Horseman, PA-C 06/05/15 1839  Linwood Dibbles, MD 06/06/15 [phone]

## 2015-06-05 NOTE — Discharge Instructions (Signed)

## 2015-06-05 NOTE — ED Notes (Signed)
Pt here with c/o dry cough x "a few days." He states it went away but it came back 3 days ago; 'right now I feel pretty good since I've taken dayquil" but is concerned about his cough and having a newborn at home.

## 2016-09-03 ENCOUNTER — Encounter (HOSPITAL_COMMUNITY): Payer: Self-pay | Admitting: Nurse Practitioner

## 2016-09-03 ENCOUNTER — Emergency Department (HOSPITAL_COMMUNITY)
Admission: EM | Admit: 2016-09-03 | Discharge: 2016-09-03 | Disposition: A | Discharge status: Home / Self Care | Payer: Self-pay | Attending: Emergency Medicine | Admitting: Emergency Medicine

## 2016-09-03 DIAGNOSIS — F17210 Nicotine dependence, cigarettes, uncomplicated: Secondary | ICD-10-CM | POA: Insufficient documentation

## 2016-09-03 DIAGNOSIS — K047 Periapical abscess without sinus: Secondary | ICD-10-CM | POA: Insufficient documentation

## 2016-09-03 MED ORDER — NAPROXEN 500 MG PO TABS: 500.0000 mg | ORAL_TABLET | Freq: Two times a day (BID) | ORAL | 0 refills | Status: DC

## 2016-09-03 MED ORDER — AMOXICILLIN 500 MG PO CAPS: 500.0000 mg | ORAL_CAPSULE | Freq: Three times a day (TID) | ORAL | 0 refills | Status: DC

## 2016-09-03 MED ORDER — TRAMADOL HCL 50 MG PO TABS: 50.0000 mg | ORAL_TABLET | Freq: Four times a day (QID) | ORAL | 0 refills | Status: DC | PRN

## 2016-09-03 MED ORDER — AMOXICILLIN 500 MG PO CAPS
500.0000 mg | ORAL_CAPSULE | Freq: Once | ORAL | Status: AC
  Administered 2016-09-03: 500 mg via ORAL
  Filled 2016-09-03: qty 1

## 2016-09-03 MED ORDER — OXYCODONE-ACETAMINOPHEN 5-325 MG PO TABS
1.0000 | ORAL_TABLET | Freq: Once | ORAL | Status: AC
  Administered 2016-09-03: 1 via ORAL
  Filled 2016-09-03: qty 1
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
## 2016-09-03 NOTE — ED Triage Notes (Signed)
Pt presents with c/o left sided dental pain. the pain began last night and was severe when he woke this morning.he was seen here in the past for dental pain and instructed to follow up with dentist but he was unable to follow up

## 2016-09-03 NOTE — Discharge Instructions (Signed)
Call Dr. Norris Crossurner's office on Monday morning and tell them you were evaluated in the ED and referred to their office. Do not drive while taking the narcotic as it will make you sleepy.

## 2020-01-29 ENCOUNTER — Other Ambulatory Visit: Payer: Self-pay

## 2020-01-29 ENCOUNTER — Encounter (HOSPITAL_COMMUNITY): Payer: Self-pay | Admitting: Emergency Medicine

## 2020-01-29 ENCOUNTER — Emergency Department (HOSPITAL_COMMUNITY)
Admission: EM | Admit: 2020-01-29 | Discharge: 2020-01-29 | Disposition: A | Discharge status: Home / Self Care | Payer: Self-pay | Attending: Emergency Medicine | Admitting: Emergency Medicine

## 2020-01-29 ENCOUNTER — Emergency Department (HOSPITAL_COMMUNITY): Payer: Self-pay

## 2020-01-29 DIAGNOSIS — F17210 Nicotine dependence, cigarettes, uncomplicated: Secondary | ICD-10-CM | POA: Insufficient documentation

## 2020-01-29 DIAGNOSIS — S301XXA Contusion of abdominal wall, initial encounter: Secondary | ICD-10-CM

## 2020-01-29 DIAGNOSIS — R109 Unspecified abdominal pain: Secondary | ICD-10-CM | POA: Insufficient documentation

## 2020-01-29 LAB — I-STAT CHEM 8, ED
BUN: 6 mg/dL (ref 6–20)
Calcium, Ion: 1.19 mmol/L (ref 1.15–1.40)
Chloride: 102 mmol/L (ref 98–111)
Creatinine, Ser: 0.7 mg/dL (ref 0.61–1.24)
Glucose, Bld: 104 mg/dL — ABNORMAL HIGH (ref 70–99)
HCT: 44 % (ref 39.0–52.0)
Hemoglobin: 15 g/dL (ref 13.0–17.0)
Potassium: 4.1 mmol/L (ref 3.5–5.1)
Sodium: 142 mmol/L (ref 135–145)
TCO2: 27 mmol/L (ref 22–32)

## 2020-01-29 LAB — I-STAT CREATININE, ED: Creatinine, Ser: 0.7 mg/dL (ref 0.61–1.24)

## 2020-01-29 MED ORDER — IOHEXOL 300 MG/ML  SOLN
100.0000 mL | Freq: Once | INTRAMUSCULAR | Status: AC | PRN
  Administered 2020-01-29: 100 mL via INTRAVENOUS

## 2020-01-29 MED ORDER — TRAMADOL HCL 50 MG PO TABS
50.0000 mg | ORAL_TABLET | Freq: Once | ORAL | Status: AC
  Administered 2020-01-29: 50 mg via ORAL
  Filled 2020-01-29: qty 1

## 2020-01-29 MED ORDER — METHOCARBAMOL 750 MG PO TABS: 750.0000 mg | ORAL_TABLET | Freq: Three times a day (TID) | ORAL | 0 refills | Status: DC | PRN
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
## 2020-01-29 NOTE — Discharge Instructions (Addendum)
It was our pleasure to provide your ER care today - we hope that you feel better.  Take acetaminophen or ibuprofen as need for pain.   You may also take robaxin as need for muscle pain/spasm - no driving for the next 6 hours, or when taking robaxin. You may also try heat therapy.   Return to ER if worse, new symptoms, fevers, severe or intractable pain, or other concern.
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
## 2022-09-15 ENCOUNTER — Encounter (HOSPITAL_COMMUNITY): Payer: Self-pay

## 2022-09-15 ENCOUNTER — Other Ambulatory Visit: Payer: Self-pay

## 2022-09-15 ENCOUNTER — Emergency Department (HOSPITAL_COMMUNITY)
Admission: EM | Admit: 2022-09-15 | Discharge: 2022-09-15 | Disposition: A | Discharge status: Home / Self Care | Payer: No Typology Code available for payment source | Attending: Emergency Medicine | Admitting: Emergency Medicine

## 2022-09-15 DIAGNOSIS — Y92410 Unspecified street and highway as the place of occurrence of the external cause: Secondary | ICD-10-CM | POA: Insufficient documentation

## 2022-09-15 DIAGNOSIS — S0083XA Contusion of other part of head, initial encounter: Secondary | ICD-10-CM | POA: Diagnosis not present

## 2022-09-15 DIAGNOSIS — M545 Low back pain, unspecified: Secondary | ICD-10-CM | POA: Diagnosis present

## 2022-09-15 DIAGNOSIS — S39012A Strain of muscle, fascia and tendon of lower back, initial encounter: Secondary | ICD-10-CM | POA: Diagnosis not present

## 2022-09-15 LAB — CBC
HCT: 41.3 % (ref 39.0–52.0)
Hemoglobin: 14.2 g/dL (ref 13.0–17.0)
MCH: 35 pg — ABNORMAL HIGH (ref 26.0–34.0)
MCHC: 34.4 g/dL (ref 30.0–36.0)
MCV: 101.7 fL — ABNORMAL HIGH (ref 80.0–100.0)
Platelets: 215 10*3/uL (ref 150–400)
RBC: 4.06 MIL/uL — ABNORMAL LOW (ref 4.22–5.81)
RDW: 12.6 % (ref 11.5–15.5)
WBC: 4.5 10*3/uL (ref 4.0–10.5)
nRBC: 0 % (ref 0.0–0.2)

## 2022-09-15 LAB — URINALYSIS, ROUTINE W REFLEX MICROSCOPIC
Bilirubin Urine: NEGATIVE
Glucose, UA: NEGATIVE mg/dL
Ketones, ur: NEGATIVE mg/dL
Nitrite: POSITIVE — AB
Protein, ur: NEGATIVE mg/dL
Specific Gravity, Urine: 1.005 (ref 1.005–1.030)
pH: 7 (ref 5.0–8.0)

## 2022-09-15 LAB — BASIC METABOLIC PANEL
Anion gap: 10 (ref 5–15)
BUN: <5 mg/dL — ABNORMAL LOW (ref 6–20)
CO2: 26 mmol/L (ref 22–32)
Calcium: 8.7 mg/dL — ABNORMAL LOW (ref 8.9–10.3)
Chloride: 99 mmol/L (ref 98–111)
Creatinine, Ser: 0.78 mg/dL (ref 0.61–1.24)
GFR, Estimated: >60 mL/min (ref >60)
Glucose, Bld: 98 mg/dL (ref 70–99)
Potassium: 3.2 mmol/L — ABNORMAL LOW (ref 3.5–5.1)
Sodium: 135 mmol/L (ref 135–145)

## 2022-09-15 LAB — CBG MONITORING, ED: Glucose-Capillary: 101 mg/dL — ABNORMAL HIGH (ref 70–99)

## 2022-09-15 MED ORDER — METHOCARBAMOL 750 MG PO TABS: 750.0000 mg | ORAL_TABLET | Freq: Three times a day (TID) | ORAL | 0 refills | Status: AC | PRN

## 2022-09-15 MED ORDER — NAPROXEN 500 MG PO TABS: 500.0000 mg | ORAL_TABLET | Freq: Two times a day (BID) | ORAL | 0 refills | Status: AC
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
## 2022-09-23 ENCOUNTER — Ambulatory Visit (HOSPITAL_COMMUNITY)
Admission: EM | Admit: 2022-09-23 | Discharge: 2022-09-23 | Disposition: A | Discharge status: Home / Self Care | Payer: Self-pay | Attending: Nurse Practitioner | Admitting: Nurse Practitioner

## 2022-09-23 ENCOUNTER — Encounter (HOSPITAL_COMMUNITY): Payer: Self-pay | Admitting: *Deleted

## 2022-09-23 DIAGNOSIS — H66011 Acute suppurative otitis media with spontaneous rupture of ear drum, right ear: Secondary | ICD-10-CM

## 2022-09-23 MED ORDER — OFLOXACIN 0.3 % OT SOLN: 10.0000 [drp] | Freq: Every day | OTIC | 0 refills | Status: AC
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
## 2022-09-23 NOTE — Discharge Instructions (Addendum)
Your right eardrum is infected and ruptured.  Please use the ofloxacin drops daily for 7 days.  Plan your left side for approximately 15 minutes after using the eardrops and when you set up, use a cottonball to prevent the eardrops from running out of your ear.  Avoid getting any water in your ears.  You can put a little bit of Vaseline on a cottonball and occlude your ear to prevent water from getting in your ear.  Follow-up with your symptoms do not improve with this treatment.

## 2022-12-15 ENCOUNTER — Other Ambulatory Visit: Payer: Self-pay

## 2022-12-15 ENCOUNTER — Encounter (HOSPITAL_COMMUNITY): Payer: Self-pay

## 2022-12-15 ENCOUNTER — Emergency Department (HOSPITAL_COMMUNITY)
Admission: EM | Admit: 2022-12-15 | Discharge: 2022-12-15 | Disposition: A | Discharge status: Home / Self Care | Payer: Self-pay | Attending: Emergency Medicine | Admitting: Emergency Medicine

## 2022-12-15 ENCOUNTER — Emergency Department (HOSPITAL_COMMUNITY): Payer: Self-pay

## 2022-12-15 DIAGNOSIS — G5731 Lesion of lateral popliteal nerve, right lower limb: Secondary | ICD-10-CM | POA: Insufficient documentation

## 2022-12-15 LAB — CBC WITH DIFFERENTIAL/PLATELET
Abs Immature Granulocytes: 0.01 10*3/uL (ref 0.00–0.07)
Basophils Absolute: 0 10*3/uL (ref 0.0–0.1)
Basophils Relative: 2 %
Eosinophils Absolute: 0 10*3/uL (ref 0.0–0.5)
Eosinophils Relative: 1 %
HCT: 42.2 % (ref 39.0–52.0)
Hemoglobin: 14.4 g/dL (ref 13.0–17.0)
Immature Granulocytes: 0 %
Lymphocytes Relative: 27 %
Lymphs Abs: 0.6 10*3/uL — ABNORMAL LOW (ref 0.7–4.0)
MCH: 35.2 pg — ABNORMAL HIGH (ref 26.0–34.0)
MCHC: 34.1 g/dL (ref 30.0–36.0)
MCV: 103.2 fL — ABNORMAL HIGH (ref 80.0–100.0)
Monocytes Absolute: 0.4 10*3/uL (ref 0.1–1.0)
Monocytes Relative: 16 %
Neutro Abs: 1.2 10*3/uL — ABNORMAL LOW (ref 1.7–7.7)
Neutrophils Relative %: 54 %
Platelets: 145 10*3/uL — ABNORMAL LOW (ref 150–400)
RBC: 4.09 MIL/uL — ABNORMAL LOW (ref 4.22–5.81)
RDW: 12.8 % (ref 11.5–15.5)
WBC: 2.2 10*3/uL — ABNORMAL LOW (ref 4.0–10.5)
nRBC: 0 % (ref 0.0–0.2)

## 2022-12-15 LAB — COMPREHENSIVE METABOLIC PANEL
ALT: 63 U/L — ABNORMAL HIGH (ref 0–44)
AST: 138 U/L — ABNORMAL HIGH (ref 15–41)
Albumin: 3.7 g/dL (ref 3.5–5.0)
Alkaline Phosphatase: 74 U/L (ref 38–126)
Anion gap: 10 (ref 5–15)
BUN: 5 mg/dL — ABNORMAL LOW (ref 6–20)
CO2: 27 mmol/L (ref 22–32)
Calcium: 8.8 mg/dL — ABNORMAL LOW (ref 8.9–10.3)
Chloride: 98 mmol/L (ref 98–111)
Creatinine, Ser: 0.73 mg/dL (ref 0.61–1.24)
GFR, Estimated: >60 mL/min (ref >60)
Glucose, Bld: 92 mg/dL (ref 70–99)
Potassium: 3.8 mmol/L (ref 3.5–5.1)
Sodium: 135 mmol/L (ref 135–145)
Total Bilirubin: 2.2 mg/dL — ABNORMAL HIGH (ref 0.3–1.2)
Total Protein: 6.8 g/dL (ref 6.5–8.1)

## 2022-12-15 LAB — PROTIME-INR
INR: 1.1 (ref 0.8–1.2)
Prothrombin Time: 14.1 seconds (ref 11.4–15.2)

## 2022-12-15 LAB — APTT: aPTT: 29 seconds (ref 24–36)

## 2022-12-15 LAB — CK: Total CK: 257 U/L (ref 49–397)

## 2022-12-15 MED ORDER — IOHEXOL 350 MG/ML SOLN
100.0000 mL | Freq: Once | INTRAVENOUS | Status: AC | PRN
  Administered 2022-12-15: 100 mL via INTRAVENOUS

## 2022-12-15 NOTE — Discharge Instructions (Addendum)
It was a pleasure caring for you today.  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 You were provided with a boot to help symptoms as they resolve. You will need to follow up with your primary care provider.  If you do not have a primary care provider.  I provided information for the community clinic in this discharge paperwork.  Seek emergency care if experiencing any new or worsening symptoms.

## 2022-12-15 NOTE — ED Provider Notes (Signed)
[facility] Provider Note   CSN: [id number] Arrival date & time: 12/15/22  0827     History  Chief Complaint  Patient presents with   Leg Pain    Hunter Hart is a 37 y.o. male who presents to ED concerned for right lower leg pain x2 days. Stating that it feels numb on dorsal side of foot "as if its asleep". Having troubles ambulating on right leg d/t symptoms. Also states that he is having trouble moving his right ankle. Denies recent trauma.  Denies fever, chest pain, dyspnea, cough. Denies recent illness.    Leg Pain      Home Medications Prior to Admission medications   Medication Sig Start Date End Date Taking? Authorizing Provider  methocarbamol (ROBAXIN-750) 750 MG tablet Take 1 tablet (750 mg total) by mouth every 8 (eight) hours as needed for muscle spasms. 09/15/22   Arby Barrette, MD  naproxen (NAPROSYN) 500 MG tablet Take 1 tablet (500 mg total) by mouth 2 (two) times daily. 09/15/22   Arby Barrette, MD      Allergies    Patient has no known allergies.    Review of Systems   Review of Systems  Musculoskeletal:        Leg pain    Physical Exam Updated Vital Signs BP 121/76 (BP Location: Left Arm)   Pulse 65   Temp 98.4 F (36.9 C) (Oral)   Resp 18   Ht 5\' 9"  (1.753 m)   Wt 65.8 kg   SpO2 98%   BMI 21.41 kg/m  Physical Exam Vitals and nursing note reviewed.  Constitutional:      General: He is not in acute distress.    Appearance: He is not ill-appearing or toxic-appearing.  HENT:     Head: Normocephalic and atraumatic.     Mouth/Throat:     Mouth: Mucous membranes are moist.  Eyes:     General: No scleral icterus.       Right eye: No discharge.        Left eye: No discharge.     Conjunctiva/sclera: Conjunctivae normal.  Cardiovascular:     Rate and Rhythm: Normal rate and regular rhythm.     Pulses: Normal pulses.     Heart sounds: Normal heart sounds. No murmur heard. Pulmonary:      Effort: Pulmonary effort is normal. No respiratory distress.     Breath sounds: Normal breath sounds. No wheezing, rhonchi or rales.  Abdominal:     General: Abdomen is flat. Bowel sounds are normal.  Musculoskeletal:     Right lower leg: No edema.     Left lower leg: No edema.     Comments: Subjective numbness of dorsal foot in fibular nerve distribution. Right pedal pulse somewhat diminished when compared to left pedal pulse.  No swelling, lesions or bruising. No calf tenderness.   Skin:    General: Skin is warm and dry.     Findings: No rash.  Neurological:     General: No focal deficit present.     Mental Status: He is alert. Mental status is at baseline.  Psychiatric:        Mood and Affect: Mood normal.     ED Results / Procedures / Treatments   Labs (all labs ordered are listed, but only abnormal results are displayed) Labs Reviewed  CBC WITH DIFFERENTIAL/PLATELET - Abnormal; Notable for the following components:      Result Value  WBC 2.2 (*)    RBC 4.09 (*)    MCV 103.2 (*)    MCH 35.2 (*)    Platelets 145 (*)    Neutro Abs 1.2 (*)    Lymphs Abs 0.6 (*)    All other components within normal limits  COMPREHENSIVE METABOLIC PANEL - Abnormal; Notable for the following components:   BUN 5 (*)    Calcium 8.8 (*)    AST 138 (*)    ALT 63 (*)    Total Bilirubin 2.2 (*)    All other components within normal limits  APTT  PROTIME-INR  CK    EKG None  Radiology CT Angio Aortobifemoral W and/or Wo Contrast  Result Date: 12/15/2022 CLINICAL DATA:  Claudication, leg ischemia EXAM: CT ANGIOGRAPHY OF ABDOMINAL AORTA WITH ILIOFEMORAL RUNOFF TECHNIQUE: Multidetector CT imaging of the abdomen, pelvis and lower extremities was performed using the standard protocol during bolus administration of intravenous contrast. Multiplanar CT image reconstructions and MIPs were obtained to evaluate the vascular anatomy. RADIATION DOSE REDUCTION: This exam was performed according to the  departmental dose-optimization program which includes automated exposure control, adjustment of the mA and/or kV according to patient size and/or use of iterative reconstruction technique. CONTRAST:  OMNIPAQUE IOHEXOL 350 MG/ML SOLN COMPARISON:  None Available. FINDINGS: VASCULAR Aorta: Normal caliber aorta without aneurysm, dissection, vasculitis or significant stenosis. Celiac: Patent without evidence of aneurysm, dissection, vasculitis or significant stenosis. SMA: Patent without evidence of aneurysm, dissection, vasculitis or significant stenosis. Renals: Both renal arteries are patent without evidence of aneurysm, dissection, vasculitis, fibromuscular dysplasia or significant stenosis. IMA: Patent without evidence of aneurysm, dissection, vasculitis or significant stenosis. RIGHT Lower Extremity Inflow: Common, internal and external iliac arteries are patent without evidence of aneurysm, dissection, vasculitis or significant stenosis. Outflow: Common, superficial and profunda femoral arteries and the popliteal artery are patent without evidence of aneurysm, dissection, vasculitis or significant stenosis. Runoff: Patent three vessel runoff to the ankle. LEFT Lower Extremity Inflow: Common, internal and external iliac arteries are patent without evidence of aneurysm, dissection, vasculitis or significant stenosis. Outflow: Common, superficial and profunda femoral arteries and the popliteal artery are patent without evidence of aneurysm, dissection, vasculitis or significant stenosis. Runoff: Patent three vessel runoff to the ankle. Veins: No obvious venous abnormality within the limitations of this arterial phase study. Review of the MIP images confirms the above findings. NON-VASCULAR Lower chest: No acute abnormality. Hepatobiliary: No focal liver abnormality is seen. No gallstones, gallbladder wall thickening, or biliary dilatation. Pancreas: Unremarkable. No pancreatic ductal dilatation or surrounding  inflammatory changes. Spleen: Normal in size without focal abnormality. Adrenals/Urinary Tract: Adrenal glands are unremarkable. Kidneys are normal, without renal calculi, focal lesion, or hydronephrosis. Bladder is unremarkable. Stomach/Bowel: Stomach is within normal limits. No evidence of bowel wall thickening, distention, or inflammatory changes. Appendix is normal. Vascular/Lymphatic: No significant vascular findings are present. No enlarged abdominal or pelvic lymph nodes. Reproductive: Prostate is unremarkable. Other: No abdominal wall hernia or abnormality. No abdominopelvic ascites. Musculoskeletal: No acute osseous abnormality. No aggressive osseous lesion. Muscles are normal. IMPRESSION: 1. No significant stenosis of the abdominal aorta, iliac arteries, femoral arteries or popliteal arteries. 2. No acute abdominal or pelvic pathology. NON-VASCULAR Electronically Signed   By: Elige Ko M.D.   On: 12/15/2022 12:25    Procedures Procedures    Medications Ordered in ED Medications  iohexol (OMNIPAQUE) 350 MG/ML injection 100 mL (100 mLs Intravenous Contrast Given 12/15/22 1046)    ED Course/ Medical Decision Making/  A&P                                 Medical Decision Making Amount and/or Complexity of Data Reviewed Labs: ordered. Radiology: ordered.  Risk Prescription drug management.   This patient presents to the ED for concern of right leg numbness, this involves an extensive number of treatment options, and is a complaint that carries with it a high risk of complications and morbidity.  The differential diagnosis includes Ischemic stroke, intracerebral hemorrhage, subarachnoid hemorrhage, Guillain-Barr syndrome, hypoglycemia, electrolyte abnormality, carbon monoxide poisoning, anemia, dehydration.   Co morbidities that complicate the patient evaluation  none   Additional history obtained:  Patient does not appear to be established with PCP.  Will refer to community  clinic.   Lab Tests:  I Ordered, and personally interpreted labs.  The pertinent results include:   -CBC: No concern for anemia or leukocytosis -CMP: AST and ALT are elevated - patient stating that he recently drank a lot of alcohol. - PTINR/APTT: within normal limits -CK: within normal limits   Imaging Studies ordered:  I ordered imaging studies including  -CT angio aortobifemoral: Given the patient was concern for right leg numbness and physical exam was concern for diminished pulse in the right leg -obtaining this imaging to rule out occlusion. I independently visualized and interpreted imaging  I agree with the radiologist interpretation   Problem List / ED Course / Critical interventions / Medication management  Patient presents to ED concerned for right leg numbness.  Numbness stretches from anterior mid shin and down into the dorsal side of the right foot.  Patient states that symptoms happened when he woke up 2 days ago.  Physical exam with somewhat diminished right pedal pulse to palpation when compared to left.  No calf tenderness.  Rest of physical exam reassuring.  Patient afebrile with stable vitals. CBC without leukocytosis or anemia.  CMP reassuring.  CK, APTT, and protime-INR were obtained to further assess for possible occlusion in the right leg.  These labs were unremarkable.  CT angio without concern for occlusion. Staffed patient with Dr. Suezanne Jacquet. He was able to see patient and noticed that his symptoms seems to be caused by peroneal nerve palsy.  Patient stating that he did drink a lot of alcohol recently.  Wondering if his symptoms could have occurred from improper sleeping position after drinking a couple nights ago.  Provided patient with a cam boot to help with ambulation.  Patient ambulating appropriately in ED.  Recommend follow-up with PCP.  Patient verbalized understanding of plan. I have reviewed the patients home medicines and have made adjustments as  needed Patient afebrile with stable vitals.  Provider with return precautions.  Discharged in good condition.   DDX: these are considered less likely due to history of present illness and physical exam findings -Ischemic stroke/ICH/SAH: no neurodeficits -Hypoglycemia/electrolyte abnormality: CMP/BMP without concern -Guillain-Barr syndrome: neuro exam reassuring; no recent illness -carbon monoxide poisoning: no nausea/vomiting; AMS; vision changes; dyspnea -anemia, dehydration: CBC/CMP/BMP without concern   Social Determinants of Health:  none          Final Clinical Impression(s) / ED Diagnoses Final diagnoses:  Right peroneal nerve palsy    Rx / DC Orders ED Discharge Orders     None         Dorthy Cooler, [place] 12/15/22 1645    Lonell Grandchild, MD 12/16/22 1549

## 2022-12-15 NOTE — ED Triage Notes (Signed)
Pt c.o right lower leg pain/numbness for the past 2 days, pt unable to flex ankle. Denies any injury or trauma.

## 2022-12-15 NOTE — Progress Notes (Signed)
Orthopedic Tech Progress Note Patient Details:  Hunter Hart 1985/03/23 [id number]  Ortho Devices Type of Ortho Device: CAM walker Ortho Device/Splint Location: RLE Ortho Device/Splint Interventions: Ordered, Application, Adjustment   Post Interventions Patient Tolerated: Well Instructions Provided: Care of device, Adjustment of device  [name] 12/15/2022, 2:05 PM

## 2022-12-15 NOTE — ED Notes (Signed)
Ortho tech to apply CAM boot.
